# Patient Record
Sex: Female | Born: 1965 | Race: Black or African American | Hispanic: No | Smoking: Never smoker
Health system: Southern US, Community
[De-identification: ages and names within clinical notes are randomized; demographics above are authoritative.]

---

## 1998-05-17 ENCOUNTER — Other Ambulatory Visit: Admission: RE | Admit: 1998-05-17 | Discharge: 1998-05-17 | Payer: Self-pay | Admitting: Obstetrics & Gynecology

## 1998-10-04 ENCOUNTER — Inpatient Hospital Stay (HOSPITAL_COMMUNITY): Admission: AD | Admit: 1998-10-04 | Discharge: 1998-10-04 | Payer: Self-pay | Admitting: Obstetrics & Gynecology

## 1998-11-06 ENCOUNTER — Inpatient Hospital Stay (HOSPITAL_COMMUNITY): Admission: AD | Admit: 1998-11-06 | Discharge: 1998-11-09 | Payer: Self-pay | Admitting: Obstetrics and Gynecology

## 2000-04-05 ENCOUNTER — Emergency Department (HOSPITAL_COMMUNITY): Admission: EM | Admit: 2000-04-05 | Discharge: 2000-04-05 | Payer: Self-pay | Admitting: Emergency Medicine

## 2004-02-20 ENCOUNTER — Other Ambulatory Visit: Admission: RE | Admit: 2004-02-20 | Discharge: 2004-02-20 | Payer: Self-pay | Admitting: Family Medicine

## 2004-12-12 ENCOUNTER — Ambulatory Visit (HOSPITAL_COMMUNITY): Admission: RE | Admit: 2004-12-12 | Discharge: 2004-12-12 | Payer: Self-pay | Admitting: Family Medicine

## 2004-12-24 ENCOUNTER — Ambulatory Visit (HOSPITAL_COMMUNITY): Admission: RE | Admit: 2004-12-24 | Discharge: 2004-12-24 | Payer: Self-pay | Admitting: Family Medicine

## 2005-04-17 ENCOUNTER — Other Ambulatory Visit: Admission: RE | Admit: 2005-04-17 | Discharge: 2005-04-17 | Payer: Self-pay | Admitting: Family Medicine

## 2010-03-02 ENCOUNTER — Encounter: Payer: Self-pay | Admitting: Family Medicine

## 2011-05-21 ENCOUNTER — Ambulatory Visit (INDEPENDENT_AMBULATORY_CARE_PROVIDER_SITE_OTHER): Payer: 59 | Admitting: Physician Assistant

## 2011-05-21 VITALS — BP 112/71 | HR 81 | Temp 98.9°F | Resp 16 | Ht 61.0 in | Wt 122.0 lb

## 2011-05-21 DIAGNOSIS — H109 Unspecified conjunctivitis: Secondary | ICD-10-CM

## 2011-05-21 MED ORDER — POLYMYXIN B-TRIMETHOPRIM 10000-0.1 UNIT/ML-% OP SOLN: 1.0000 [drp] | OPHTHALMIC | Status: AC

## 2011-05-21 NOTE — Patient Instructions (Signed)
Warm compresses can help ease the discomfort, as can ibuprofen.  Wipe down ALL surfaces with a cleanser to disinfect. Wash your bed linens, too!

## 2011-05-21 NOTE — Progress Notes (Signed)
  Subjective:    Patient ID: Madeline Nguyen, female    DOB: 29-Mar-1965, 46 y.o.   MRN: [id number]  HPI  "I think I have conjunctivitis."  Works with children.  Left eye was irritated and red yesterday, awoke this am with eyes matted.  Today right eye beginning to feel irritated.No drainage.  Itchy, like sand in eyes. Child in her care evaluated today for possible pink eye. No history of FB or trauma.  Review of Systems No visual change.  No recent illness. No URI-type symptoms.    Objective:   Physical Exam  Vital signs noted. Well-developed, well nourished BF who is awake, alert and oriented, in NAD. HEENT: [place]/AT, PERRL, EOMI.  Sclera and conjunctiva are clear.  Funduscopic exam is normal bilaterally. Neck: supple, non-tender, no lymphadenopathey, thyromegaly. Skin: warm and dry with hyperpigmentation under the right eye consistent with rubbing.     Assessment & Plan:   1. Conjunctivitis  trimethoprim-polymyxin b (POLYTRIM) ophthalmic solution   While this may be allergic, she is at high risk for bacterial conjunctivitis given her work.

## 2011-09-23 ENCOUNTER — Other Ambulatory Visit (HOSPITAL_COMMUNITY)
Admission: RE | Admit: 2011-09-23 | Discharge: 2011-09-23 | Disposition: A | Discharge status: Home / Self Care | Payer: 59 | Source: Ambulatory Visit | Attending: Family Medicine | Admitting: Family Medicine

## 2011-09-23 DIAGNOSIS — Z124 Encounter for screening for malignant neoplasm of cervix: Secondary | ICD-10-CM | POA: Insufficient documentation

## 2013-08-19 ENCOUNTER — Encounter (HOSPITAL_COMMUNITY): Payer: Self-pay | Admitting: Emergency Medicine

## 2013-08-19 ENCOUNTER — Emergency Department (HOSPITAL_COMMUNITY)
Admission: EM | Admit: 2013-08-19 | Discharge: 2013-08-20 | Discharge status: Against Medical Advice | Payer: Managed Care, Other (non HMO) | Attending: Emergency Medicine | Admitting: Emergency Medicine

## 2013-08-19 DIAGNOSIS — T783XXA Angioneurotic edema, initial encounter: Secondary | ICD-10-CM | POA: Insufficient documentation

## 2013-08-19 DIAGNOSIS — T4995XA Adverse effect of unspecified topical agent, initial encounter: Secondary | ICD-10-CM | POA: Insufficient documentation

## 2013-08-19 NOTE — ED Notes (Signed)
Pt. presents with upper lip itching / swelling onset this evening after eating , airway intact / respirations unlabored.

## 2013-09-23 ENCOUNTER — Other Ambulatory Visit: Payer: Self-pay | Admitting: Family Medicine

## 2013-09-23 ENCOUNTER — Other Ambulatory Visit (HOSPITAL_COMMUNITY)
Admission: RE | Admit: 2013-09-23 | Discharge: 2013-09-23 | Disposition: A | Discharge status: Home / Self Care | Payer: Managed Care, Other (non HMO) | Source: Ambulatory Visit | Attending: Family Medicine | Admitting: Family Medicine

## 2013-09-23 DIAGNOSIS — Z124 Encounter for screening for malignant neoplasm of cervix: Secondary | ICD-10-CM | POA: Insufficient documentation

## 2013-09-23 DIAGNOSIS — Z1231 Encounter for screening mammogram for malignant neoplasm of breast: Secondary | ICD-10-CM

## 2013-09-28 LAB — CYTOLOGY - PAP

## 2013-11-07 ENCOUNTER — Ambulatory Visit: Payer: Managed Care, Other (non HMO)

## 2014-03-23 ENCOUNTER — Other Ambulatory Visit: Payer: Self-pay | Admitting: Family Medicine

## 2014-03-23 ENCOUNTER — Ambulatory Visit
Admission: RE | Admit: 2014-03-23 | Discharge: 2014-03-23 | Disposition: A | Discharge status: Home / Self Care | Payer: Managed Care, Other (non HMO) | Source: Ambulatory Visit | Attending: Family Medicine | Admitting: Family Medicine

## 2014-03-23 DIAGNOSIS — M549 Dorsalgia, unspecified: Secondary | ICD-10-CM

## 2016-09-01 ENCOUNTER — Encounter: Payer: Self-pay | Admitting: Podiatry

## 2016-09-01 ENCOUNTER — Ambulatory Visit (INDEPENDENT_AMBULATORY_CARE_PROVIDER_SITE_OTHER): Payer: 59 | Admitting: Podiatry

## 2016-09-01 ENCOUNTER — Ambulatory Visit (INDEPENDENT_AMBULATORY_CARE_PROVIDER_SITE_OTHER): Payer: 59

## 2016-09-01 DIAGNOSIS — M722 Plantar fascial fibromatosis: Secondary | ICD-10-CM | POA: Diagnosis not present

## 2016-09-01 NOTE — Patient Instructions (Signed)

## 2016-09-01 NOTE — Progress Notes (Signed)
   Subjective:    Patient ID: Madeline Nguyen, female    DOB: [birth year], 51 y.o.   MRN: [id number]  HPI 51 year old female presents the office if the concerns left heel pain which is been ongoing for about 3-4 months. She states that she gets pain when she first gets up and after being on her feet all day. She states that she can walk around her the day without any pain. She states that it came on suddenly but denies any recent injury or trauma. She states is gradually getting worse. She has tried icing as well as changing shoes without any significant improvement. The pain does not wake her up at night. No numbness or tingling. She has no other complaints today.   Review of Systems  Constitutional: Positive for fatigue.  Allergic/Immunologic: Positive for food allergies.       Objective:   Physical Exam General: AAO x3, NAD  Dermatological: Scar on the right foot from prior surgeries well-healed. Skin is warm, dry and supple bilateral. Nails x 10 are well manicured; remaining integument appears unremarkable at this time. There are no open sores, no preulcerative lesions, no rash or signs of infection present.  Vascular: Dorsalis Pedis artery and Posterior Tibial artery pedal pulses are 2/4 bilateral with immedate capillary fill time. Pedal hair growth present.  There is no pain with calf compression, swelling, warmth, erythema.   Neruologic: Grossly intact via light touch bilateral. Vibratory intact via tuning fork bilateral. Protective threshold with Semmes Wienstein monofilament intact to all pedal sites bilateral. Negative tinel sign.   Musculoskeletal: Tenderness to palpation along the plantar medial tubercle of the calcaneus at the insertion of plantar fascia on the left foot. There is no pain along the course of the plantar fascia within the arch of the foot. Plantar fascia appears to be intact. There is no pain with lateral compression of the calcaneus or pain with vibratory  sensation. There is no pain along the course or insertion of the achilles tendon. No other areas of tenderness to bilateral lower extremities. Muscular strength 5/5 in all groups tested bilateral.  Gait: Unassisted, Nonantalgic.     Assessment & Plan:  51 year old female with left heel pain likely plantar fasciitis with small inferior calcaneal spurring. -Treatment options discussed including all alternatives, risks, and complications -Etiology of symptoms were discussed -X-rays were obtained and reviewed with the patient. Small inferior calcaneal spurs present. No evidence of acute fracture identified. -Declined steroid injection today. -Prescribed mobic. Discussed side effects of the medication and directed to stop if any are to occur and call the office.  -Stretching, icing exercises daily -Plantar fascial brace dispensed -Shoe gear modifications and orthotics discussed. A coupon was given to her for [employer] to get inserts.  -RTC 4 weeks or sooner if needed.  Ovid CurdMatthew Joli Koob, DPM

## 2016-09-02 ENCOUNTER — Telehealth: Payer: Self-pay | Admitting: Podiatry

## 2016-09-02 MED ORDER — MELOXICAM 15 MG PO TABS: 15.0000 mg | ORAL_TABLET | Freq: Every day | ORAL | 0 refills | Status: AC

## 2016-09-02 NOTE — Telephone Encounter (Signed)
Left message informing pt the [facility]

## 2016-09-02 NOTE — Telephone Encounter (Signed)
I saw Dr. Ardelle AntonWagoner yesterday and he was supposed to call in an Rx for inflammation to my pharmacy but they have not received anything. Could you please get that called in.

## 2016-09-02 NOTE — Addendum Note (Signed)
Addended by: Alphia Kava'CONNELL, VALERY D on: 09/02/2016 04:31 PM   Modules accepted: Orders

## 2016-10-09 ENCOUNTER — Other Ambulatory Visit: Payer: Self-pay | Admitting: Family Medicine

## 2016-10-09 DIAGNOSIS — Z1231 Encounter for screening mammogram for malignant neoplasm of breast: Secondary | ICD-10-CM

## 2016-10-24 ENCOUNTER — Ambulatory Visit
Admission: RE | Admit: 2016-10-24 | Discharge: 2016-10-24 | Disposition: A | Discharge status: Home / Self Care | Payer: Managed Care, Other (non HMO) | Source: Ambulatory Visit | Attending: Family Medicine | Admitting: Family Medicine

## 2016-10-24 DIAGNOSIS — Z1231 Encounter for screening mammogram for malignant neoplasm of breast: Secondary | ICD-10-CM

## 2018-03-04 ENCOUNTER — Other Ambulatory Visit: Payer: Self-pay | Admitting: Family Medicine

## 2018-03-04 ENCOUNTER — Other Ambulatory Visit (HOSPITAL_COMMUNITY)
Admission: RE | Admit: 2018-03-04 | Discharge: 2018-03-04 | Disposition: A | Discharge status: Home / Self Care | Payer: 59 | Source: Ambulatory Visit | Attending: Family Medicine | Admitting: Family Medicine

## 2018-03-04 DIAGNOSIS — Z124 Encounter for screening for malignant neoplasm of cervix: Secondary | ICD-10-CM | POA: Insufficient documentation

## 2018-03-08 LAB — CYTOLOGY - PAP: Diagnosis: NEGATIVE

## 2018-09-24 ENCOUNTER — Other Ambulatory Visit: Payer: Self-pay

## 2018-09-24 DIAGNOSIS — Z20822 Contact with and (suspected) exposure to covid-19: Secondary | ICD-10-CM

## 2018-09-26 LAB — NOVEL CORONAVIRUS, NAA: SARS-CoV-2, NAA: NOT DETECTED

## 2019-10-25 ENCOUNTER — Other Ambulatory Visit: Payer: Self-pay

## 2019-10-25 ENCOUNTER — Ambulatory Visit (INDEPENDENT_AMBULATORY_CARE_PROVIDER_SITE_OTHER): Payer: 59 | Admitting: Podiatry

## 2019-10-25 ENCOUNTER — Ambulatory Visit (INDEPENDENT_AMBULATORY_CARE_PROVIDER_SITE_OTHER): Payer: 59

## 2019-10-25 DIAGNOSIS — M722 Plantar fascial fibromatosis: Secondary | ICD-10-CM | POA: Diagnosis not present

## 2019-10-25 MED ORDER — MELOXICAM 15 MG PO TABS: 15.0000 mg | ORAL_TABLET | Freq: Every day | ORAL | 1 refills | Status: AC

## 2019-10-25 NOTE — Patient Instructions (Signed)

## 2019-11-01 NOTE — Progress Notes (Signed)
Subjective:   Patient ID: Madeline Nguyen, female   DOB: 54 y.o.   MRN: [id number]   HPI 54 year old female presents the office today for concerns of recurrent left heel pain, plantar fasciitis.  She states meloxicam is helping.  She denies any recent injury or trauma she denies any swelling or any radiating pain.  She has pain in the bottom of her heel particularly after rest.  She has no other concerns today.   Review of Systems  All other systems reviewed and are negative.  No past medical history on file.  Past Surgical History:  Procedure Laterality Date  . CESAREAN SECTION       Current Outpatient Medications:  .  etodolac (LODINE) 400 MG tablet, Take 400 mg by mouth 2 (two) times daily as needed., Disp: , Rfl:  .  meloxicam (MOBIC) 15 MG tablet, Take 1 tablet (15 mg total) by mouth daily., Disp: 30 tablet, Rfl: 0 .  meloxicam (MOBIC) 15 MG tablet, Take 1 tablet (15 mg total) by mouth daily., Disp: 30 tablet, Rfl: 1  No Known Allergies        Objective:  Physical Exam  General: AAO x3, NAD  Dermatological: Skin is warm, dry and supple bilateral.There are no open sores, no preulcerative lesions, no rash or signs of infection present.  Vascular: Dorsalis Pedis artery and Posterior Tibial artery pedal pulses are 2/4 bilateral with immedate capillary fill time. There is no pain with calf compression, swelling, warmth, erythema.   Neruologic: Grossly intact via light touch bilateral.  Negative Tinel sign  Musculoskeletal: There is tenderness palpation on plantar medial tubercle of the calcaneus with insertion of plantar fascial.  The plantar pressure appears to be intact.  No pain with mild compression of calcaneus.  There is no pain in the Achilles tendon.  Flexor, extensor tendons appear intact.  Muscular strength 5/5 in all groups tested bilateral.  Gait: Unassisted, Nonantalgic.       Assessment:   54 year old female left foot plantar fasciitis    Plan:   -Treatment options discussed including all alternatives, risks, and complications -Etiology of symptoms were discussed -X-rays obtained reviewed.  No evidence of acute fracture or stress fracture. -Prescribed mobic. Discussed side effects of the medication and directed to stop if any are to occur and call the office.  -Would like to hold off on steroid injection -Discussed stretching, icing daily.  Return to bring the plantar surface peer discussed orthotics.  Vivi Barrack DPM

## 2019-12-26 ENCOUNTER — Ambulatory Visit: Payer: 59 | Admitting: Podiatry

## 2020-07-31 ENCOUNTER — Telehealth: Payer: 59 | Admitting: Podiatry

## 2020-07-31 ENCOUNTER — Other Ambulatory Visit: Payer: Self-pay | Admitting: Family Medicine

## 2020-07-31 DIAGNOSIS — Z1231 Encounter for screening mammogram for malignant neoplasm of breast: Secondary | ICD-10-CM

## 2020-07-31 NOTE — Telephone Encounter (Signed)
Tried to call patient and left a message stating to call me back at [phone]. Madeline Nguyen

## 2020-07-31 NOTE — Telephone Encounter (Signed)
Pt called and stated that she tried to get her Meloxicam refilled and the pharmacy told her to call the office

## 2020-08-01 NOTE — Telephone Encounter (Signed)
Called and spoke with the patient and patient stated that the left heel was tender and sore at times and my be on it too much and patient stated that she would call back to make an appointment. Misty Stanley

## 2020-09-21 ENCOUNTER — Other Ambulatory Visit: Payer: Self-pay

## 2020-09-21 ENCOUNTER — Ambulatory Visit
Admission: RE | Admit: 2020-09-21 | Discharge: 2020-09-21 | Disposition: A | Discharge status: Home / Self Care | Payer: 59 | Source: Ambulatory Visit | Attending: Family Medicine | Admitting: Family Medicine

## 2020-09-21 DIAGNOSIS — Z1231 Encounter for screening mammogram for malignant neoplasm of breast: Secondary | ICD-10-CM

## 2020-12-20 ENCOUNTER — Other Ambulatory Visit: Payer: Self-pay

## 2020-12-20 ENCOUNTER — Ambulatory Visit (INDEPENDENT_AMBULATORY_CARE_PROVIDER_SITE_OTHER): Payer: 59

## 2020-12-20 ENCOUNTER — Ambulatory Visit (INDEPENDENT_AMBULATORY_CARE_PROVIDER_SITE_OTHER): Payer: 59 | Admitting: Podiatry

## 2020-12-20 DIAGNOSIS — M7732 Calcaneal spur, left foot: Secondary | ICD-10-CM

## 2020-12-20 DIAGNOSIS — M722 Plantar fascial fibromatosis: Secondary | ICD-10-CM | POA: Diagnosis not present

## 2020-12-20 MED ORDER — MELOXICAM 15 MG PO TABS: 15.0000 mg | ORAL_TABLET | Freq: Every day | ORAL | 0 refills | Status: AC | PRN

## 2020-12-20 NOTE — Patient Instructions (Signed)

## 2020-12-23 NOTE — Progress Notes (Signed)
Subjective: 55 year old female presents the office today for concerns of reoccurrence of plantar fasciotomy left heel.  She said that when she takes the meloxicam it does help.  She has not been doing any other stretching, icing.  She can try to wear shoes and good arch support.  She states that her heel will not hurt for couple weeks and then will start to get tender again.  Wants to discuss other treatment options.  No recent injury.  Objective: AAO x3, NAD DP/PT pulses palpable bilaterally, CRT less than 3 seconds There is tenderness palpation along the plantar medial tubercle of the calcaneus at the insertion of plantar fascia on the left foot.  There is no pain with lateral compression of calcaneus.  No pain the arch of the foot.  No pain with Achilles tendon.  No edema, erythema.  MMT 5/5.  No pain with calf compression, swelling, warmth, erythema  Assessment: Left recurrence plantar fasciitis  Plan: -All treatment options discussed with the patient including all alternatives, risks, complications.  -Repeat x-rays obtained reviewed.  No evidence of fracture.  Inferior calcaneal spurring is present.  No significant changes. -At this point we discussed steroid injection but will hold off on that.  Refill meloxicam to use as needed.  Discussed stretching, icing daily.  She states that she is going to get better with that as well.  Continue shoes and good arch support.  Discussed other treatment options including physical therapy, EPAT as well.  -Patient encouraged to call the office with any questions, concerns, change in symptoms.   Vivi Barrack DPM

## 2021-02-19 ENCOUNTER — Ambulatory Visit: Payer: 59 | Admitting: Podiatry

## 2021-03-15 ENCOUNTER — Other Ambulatory Visit: Payer: Self-pay | Admitting: Family Medicine

## 2021-03-15 ENCOUNTER — Ambulatory Visit
Admission: RE | Admit: 2021-03-15 | Discharge: 2021-03-15 | Disposition: A | Discharge status: Home / Self Care | Payer: 59 | Source: Ambulatory Visit | Attending: Family Medicine | Admitting: Family Medicine

## 2021-03-15 DIAGNOSIS — R059 Cough, unspecified: Secondary | ICD-10-CM

## 2021-04-03 DIAGNOSIS — M545 Low back pain, unspecified: Secondary | ICD-10-CM | POA: Diagnosis not present

## 2021-04-08 DIAGNOSIS — M545 Low back pain, unspecified: Secondary | ICD-10-CM | POA: Diagnosis not present

## 2021-04-18 DIAGNOSIS — L439 Lichen planus, unspecified: Secondary | ICD-10-CM | POA: Diagnosis not present

## 2021-04-19 DIAGNOSIS — M545 Low back pain, unspecified: Secondary | ICD-10-CM | POA: Diagnosis not present

## 2021-07-15 DIAGNOSIS — R220 Localized swelling, mass and lump, head: Secondary | ICD-10-CM | POA: Diagnosis not present

## 2021-08-01 DIAGNOSIS — D649 Anemia, unspecified: Secondary | ICD-10-CM | POA: Diagnosis not present

## 2021-08-01 DIAGNOSIS — Z124 Encounter for screening for malignant neoplasm of cervix: Secondary | ICD-10-CM | POA: Diagnosis not present

## 2021-08-01 DIAGNOSIS — Z1211 Encounter for screening for malignant neoplasm of colon: Secondary | ICD-10-CM | POA: Diagnosis not present

## 2021-08-01 DIAGNOSIS — E7800 Pure hypercholesterolemia, unspecified: Secondary | ICD-10-CM | POA: Diagnosis not present

## 2021-08-01 DIAGNOSIS — M542 Cervicalgia: Secondary | ICD-10-CM | POA: Diagnosis not present

## 2021-08-01 DIAGNOSIS — Z0000 Encounter for general adult medical examination without abnormal findings: Secondary | ICD-10-CM | POA: Diagnosis not present

## 2021-08-01 DIAGNOSIS — L501 Idiopathic urticaria: Secondary | ICD-10-CM | POA: Diagnosis not present

## 2021-08-01 DIAGNOSIS — Z23 Encounter for immunization: Secondary | ICD-10-CM | POA: Diagnosis not present

## 2021-08-01 DIAGNOSIS — N951 Menopausal and female climacteric states: Secondary | ICD-10-CM | POA: Diagnosis not present

## 2021-08-05 DIAGNOSIS — Z1211 Encounter for screening for malignant neoplasm of colon: Secondary | ICD-10-CM | POA: Diagnosis not present

## 2021-10-03 DIAGNOSIS — Z23 Encounter for immunization: Secondary | ICD-10-CM | POA: Diagnosis not present

## 2021-10-07 ENCOUNTER — Other Ambulatory Visit: Payer: Self-pay | Admitting: Family Medicine

## 2021-10-07 DIAGNOSIS — Z1231 Encounter for screening mammogram for malignant neoplasm of breast: Secondary | ICD-10-CM

## 2021-11-08 ENCOUNTER — Ambulatory Visit
Admission: RE | Admit: 2021-11-08 | Discharge: 2021-11-08 | Disposition: A | Discharge status: Home / Self Care | Payer: 59 | Source: Ambulatory Visit | Attending: Family Medicine | Admitting: Family Medicine

## 2021-11-08 DIAGNOSIS — Z1231 Encounter for screening mammogram for malignant neoplasm of breast: Secondary | ICD-10-CM | POA: Diagnosis not present

## 2022-02-27 DIAGNOSIS — R059 Cough, unspecified: Secondary | ICD-10-CM | POA: Diagnosis not present

## 2022-02-27 DIAGNOSIS — J069 Acute upper respiratory infection, unspecified: Secondary | ICD-10-CM | POA: Diagnosis not present

## 2022-04-29 ENCOUNTER — Ambulatory Visit (INDEPENDENT_AMBULATORY_CARE_PROVIDER_SITE_OTHER): Payer: 59 | Admitting: Podiatry

## 2022-04-29 ENCOUNTER — Ambulatory Visit (INDEPENDENT_AMBULATORY_CARE_PROVIDER_SITE_OTHER): Payer: 59

## 2022-04-29 ENCOUNTER — Ambulatory Visit: Payer: 59

## 2022-04-29 DIAGNOSIS — M2141 Flat foot [pes planus] (acquired), right foot: Secondary | ICD-10-CM | POA: Diagnosis not present

## 2022-04-29 DIAGNOSIS — M79671 Pain in right foot: Secondary | ICD-10-CM

## 2022-04-29 DIAGNOSIS — M2142 Flat foot [pes planus] (acquired), left foot: Secondary | ICD-10-CM | POA: Diagnosis not present

## 2022-04-29 DIAGNOSIS — M7742 Metatarsalgia, left foot: Secondary | ICD-10-CM

## 2022-04-29 DIAGNOSIS — M722 Plantar fascial fibromatosis: Secondary | ICD-10-CM

## 2022-04-29 DIAGNOSIS — M7741 Metatarsalgia, right foot: Secondary | ICD-10-CM | POA: Diagnosis not present

## 2022-04-29 MED ORDER — MELOXICAM 15 MG PO TABS: 15.0000 mg | ORAL_TABLET | Freq: Every day | ORAL | 0 refills | Status: DC | PRN

## 2022-04-29 NOTE — Progress Notes (Signed)
  Subjective: Chief Complaint  Patient presents with   Foot Problem    Patient feels arch is dropping in bilateral feet, right over left, pain located in the arch , balls of feet, and heel    57 year old female presents the office for above concerns.  She feels that her arches are falling.  Getting pain under the toes, ball of her foot.  Pain of the heel has not been as bad but she recently was working so she is having some discomfort of the heels today.  No recent injury or changes otherwise.  Objective: AAO x3, NAD DP/PT pulses palpable bilaterally, CRT less than 3 seconds Decreased medial arch upon weightbearing.  There is mild tenderness palpation on the plantar aspect the calcaneus and the insertion of plantar fascia.  There is no pain with lateral compression of the calcaneus.  She has some discomfort submetatarsal area on the plantar aspect of the toes but no specific area of pinpoint tenderness.  No edema no erythema.  Mild bunion left foot no Tinel sign.  MMT 5/5. No pain with calf compression, swelling, warmth, erythema  Assessment: Plan fasciitis, metatarsalgia, flatfoot  Plan: -All treatment options discussed with the patient including all alternatives, risks, complications.  -X-rays were obtained reviewed.  3 views of the feet were obtained.  No evidence of acute fracture.  Previous bunionectomy noted on the right side.  Calcaneal spurring is present.  Decreased calcaneal inclination well. -Prescribed mobic. Discussed side effects of the medication and directed to stop if any are to occur and call the office.  -Dispensed power step inserts for her to help support the plantar fascia, arch of her foot. -Discussed stretching, icing. -Discussed shoe gear changes. -Patient encouraged to call the office with any questions, concerns, change in symptoms.   Trula Slade DPM

## 2022-04-29 NOTE — Patient Instructions (Signed)
For shoes I would recommend going to [facility]  Plantar Fasciitis (Heel Spur Syndrome) with Rehab The plantar fascia is a fibrous, ligament-like, soft-tissue structure that spans the bottom of the foot. Plantar fasciitis is a condition that causes pain in the foot due to inflammation of the tissue. SYMPTOMS  Pain and tenderness on the underneath side of the foot. Pain that worsens with standing or walking. CAUSES  Plantar fasciitis is caused by irritation and injury to the plantar fascia on the underneath side of the foot. Common mechanisms of injury include: Direct trauma to bottom of the foot. Damage to a small nerve that runs under the foot where the main fascia attaches to the heel bone. Stress placed on the plantar fascia due to bone spurs. RISK INCREASES WITH:  Activities that place stress on the plantar fascia (running, jumping, pivoting, or cutting). Poor strength and flexibility. Improperly fitted shoes. Tight calf muscles. Flat feet. Failure to warm-up properly before activity. Obesity. PREVENTION Warm up and stretch properly before activity. Allow for adequate recovery between workouts. Maintain physical fitness: Strength, flexibility, and endurance. Cardiovascular fitness. Maintain a health body weight. Avoid stress on the plantar fascia. Wear properly fitted shoes, including arch supports for individuals who have flat feet.  PROGNOSIS  If treated properly, then the symptoms of plantar fasciitis usually resolve without surgery. However, occasionally surgery is necessary.  RELATED COMPLICATIONS  Recurrent symptoms that may result in a chronic condition. Problems of the lower back that are caused by compensating for the injury, such as limping. Pain or weakness of the foot during push-off following surgery. Chronic inflammation, scarring, and partial or complete fascia tear, occurring more often from repeated injections.  TREATMENT  Treatment initially involves the  use of ice and medication to help reduce pain and inflammation. The use of strengthening and stretching exercises may help reduce pain with activity, especially stretches of the Achilles tendon. These exercises may be performed at home or with a therapist. Your caregiver may recommend that you use heel cups of arch supports to help reduce stress on the plantar fascia. Occasionally, corticosteroid injections are given to reduce inflammation. If symptoms persist for greater than 6 months despite non-surgical (conservative), then surgery may be recommended.   MEDICATION  If pain medication is necessary, then nonsteroidal anti-inflammatory medications, such as aspirin and ibuprofen, or other minor pain relievers, such as acetaminophen, are often recommended. Do not take pain medication within 7 days before surgery. Prescription pain relievers may be given if deemed necessary by your caregiver. Use only as directed and only as much as you need. Corticosteroid injections may be given by your caregiver. These injections should be reserved for the most serious cases, because they may only be given a certain number of times.  HEAT AND COLD Cold treatment (icing) relieves pain and reduces inflammation. Cold treatment should be applied for 10 to 15 minutes every 2 to 3 hours for inflammation and pain and immediately after any activity that aggravates your symptoms. Use ice packs or massage the area with a piece of ice (ice massage). Heat treatment may be used prior to performing the stretching and strengthening activities prescribed by your caregiver, physical therapist, or athletic trainer. Use a heat pack or soak the injury in warm water.  SEEK IMMEDIATE MEDICAL CARE IF: Treatment seems to offer no benefit, or the condition worsens. Any medications produce adverse side effects.  EXERCISES- RANGE OF MOTION (ROM) AND STRETCHING EXERCISES - Plantar Fasciitis (Heel Spur Syndrome) These exercises may  help you  when beginning to rehabilitate your injury. Your symptoms may resolve with or without further involvement from your physician, physical therapist or athletic trainer. While completing these exercises, remember:  Restoring tissue flexibility helps normal motion to return to the joints. This allows healthier, less painful movement and activity. An effective stretch should be held for at least 30 seconds. A stretch should never be painful. You should only feel a gentle lengthening or release in the stretched tissue.  RANGE OF MOTION - Toe Extension, Flexion Sit with your right / left leg crossed over your opposite knee. Grasp your toes and gently pull them back toward the top of your foot. You should feel a stretch on the bottom of your toes and/or foot. Hold this stretch for 10 seconds. Now, gently pull your toes toward the bottom of your foot. You should feel a stretch on the top of your toes and or foot. Hold this stretch for 10 seconds. Repeat  times. Complete this stretch 3 times per day.   RANGE OF MOTION - Ankle Dorsiflexion, Active Assisted Remove shoes and sit on a chair that is preferably not on a carpeted surface. Place right / left foot under knee. Extend your opposite leg for support. Keeping your heel down, slide your right / left foot back toward the chair until you feel a stretch at your ankle or calf. If you do not feel a stretch, slide your bottom forward to the edge of the chair, while still keeping your heel down. Hold this stretch for 10 seconds. Repeat 3 times. Complete this stretch 2 times per day.   STRETCH  Gastroc, Standing Place hands on wall. Extend right / left leg, keeping the front knee somewhat bent. Slightly point your toes inward on your back foot. Keeping your right / left heel on the floor and your knee straight, shift your weight toward the wall, not allowing your back to arch. You should feel a gentle stretch in the right / left calf. Hold this position for  10 seconds. Repeat 3 times. Complete this stretch 2 times per day.  STRETCH  Soleus, Standing Place hands on wall. Extend right / left leg, keeping the other knee somewhat bent. Slightly point your toes inward on your back foot. Keep your right / left heel on the floor, bend your back knee, and slightly shift your weight over the back leg so that you feel a gentle stretch deep in your back calf. Hold this position for 10 seconds. Repeat 3 times. Complete this stretch 2 times per day.  STRETCH  Gastrocsoleus, Standing  Note: This exercise can place a lot of stress on your foot and ankle. Please complete this exercise only if specifically instructed by your caregiver.  Place the ball of your right / left foot on a step, keeping your other foot firmly on the same step. Hold on to the wall or a rail for balance. Slowly lift your other foot, allowing your body weight to press your heel down over the edge of the step. You should feel a stretch in your right / left calf. Hold this position for 10 seconds. Repeat this exercise with a slight bend in your right / left knee. Repeat 3 times. Complete this stretch 2 times per day.   STRENGTHENING EXERCISES - Plantar Fasciitis (Heel Spur Syndrome)  These exercises may help you when beginning to rehabilitate your injury. They may resolve your symptoms with or without further involvement from your physician, physical therapist or  Product/process development scientist. While completing these exercises, remember:  Muscles can gain both the endurance and the strength needed for everyday activities through controlled exercises. Complete these exercises as instructed by your physician, physical therapist or athletic trainer. Progress the resistance and repetitions only as guided.  STRENGTH - Towel Curls Sit in a chair positioned on a non-carpeted surface. Place your foot on a towel, keeping your heel on the floor. Pull the towel toward your heel by only curling your toes. Keep  your heel on the floor. Repeat 3 times. Complete this exercise 2 times per day.  STRENGTH - Ankle Inversion Secure one end of a rubber exercise band/tubing to a fixed object (table, pole). Loop the other end around your foot just before your toes. Place your fists between your knees. This will focus your strengthening at your ankle. Slowly, pull your big toe up and in, making sure the band/tubing is positioned to resist the entire motion. Hold this position for 10 seconds. Have your muscles resist the band/tubing as it slowly pulls your foot back to the starting position. Repeat 3 times. Complete this exercises 2 times per day.  Document Released: 01/27/2005 Document Revised: 04/21/2011 Document Reviewed: 05/11/2008 [facility] Patient Information 2014 [place], [place].

## 2022-05-31 ENCOUNTER — Other Ambulatory Visit: Payer: Self-pay | Admitting: Podiatry

## 2022-07-01 ENCOUNTER — Ambulatory Visit: Payer: 59 | Admitting: Podiatry

## 2022-09-03 DIAGNOSIS — M79643 Pain in unspecified hand: Secondary | ICD-10-CM | POA: Diagnosis not present

## 2022-10-23 DIAGNOSIS — Z0000 Encounter for general adult medical examination without abnormal findings: Secondary | ICD-10-CM | POA: Diagnosis not present

## 2022-10-23 DIAGNOSIS — E7800 Pure hypercholesterolemia, unspecified: Secondary | ICD-10-CM | POA: Diagnosis not present

## 2022-10-23 DIAGNOSIS — L501 Idiopathic urticaria: Secondary | ICD-10-CM | POA: Diagnosis not present

## 2022-10-23 DIAGNOSIS — D179 Benign lipomatous neoplasm, unspecified: Secondary | ICD-10-CM | POA: Diagnosis not present

## 2022-10-23 DIAGNOSIS — L439 Lichen planus, unspecified: Secondary | ICD-10-CM | POA: Diagnosis not present

## 2022-10-23 DIAGNOSIS — Z1211 Encounter for screening for malignant neoplasm of colon: Secondary | ICD-10-CM | POA: Diagnosis not present

## 2022-10-23 DIAGNOSIS — M542 Cervicalgia: Secondary | ICD-10-CM | POA: Diagnosis not present

## 2022-10-23 DIAGNOSIS — N951 Menopausal and female climacteric states: Secondary | ICD-10-CM | POA: Diagnosis not present

## 2022-10-23 DIAGNOSIS — D649 Anemia, unspecified: Secondary | ICD-10-CM | POA: Diagnosis not present

## 2022-10-30 DIAGNOSIS — Z1211 Encounter for screening for malignant neoplasm of colon: Secondary | ICD-10-CM | POA: Diagnosis not present

## 2022-11-07 IMAGING — MG MM DIGITAL SCREENING BILAT W/ TOMO AND CAD
8 series · 8 of 24 positions shown · non-contrast
Comparison: Previous exam(s).

CLINICAL DATA: Screening.

EXAM:
DIGITAL SCREENING BILATERAL MAMMOGRAM WITH TOMOSYNTHESIS AND CAD
TECHNIQUE: Bilateral screening digital craniocaudal and mediolateral oblique
mammograms were obtained. Bilateral screening digital breast
tomosynthesis was performed. The images were evaluated with
computer-aided detection.

[L MLO synth-2D]
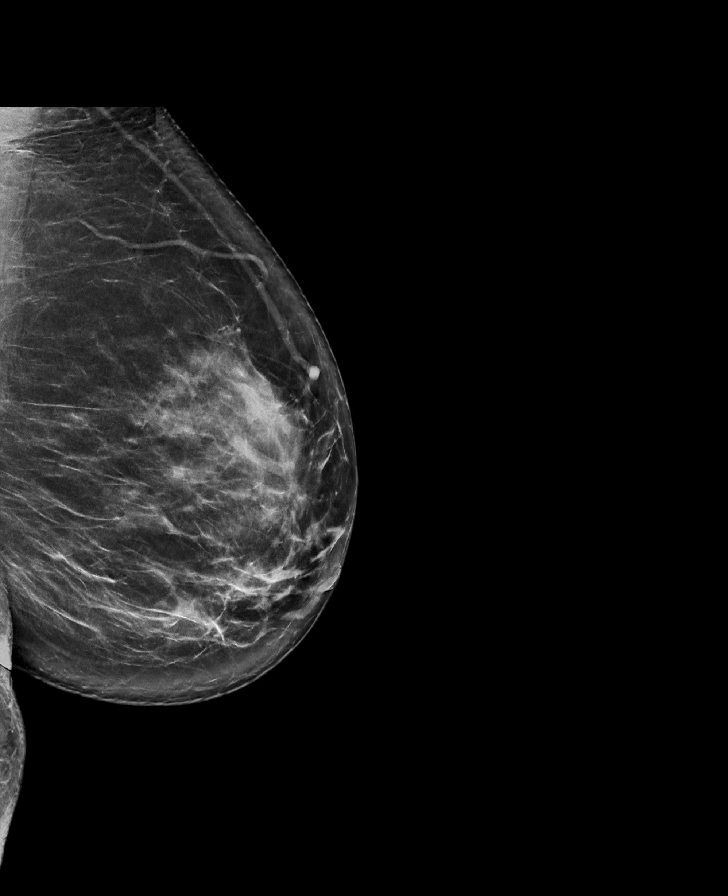

[R MLO synth-2D]
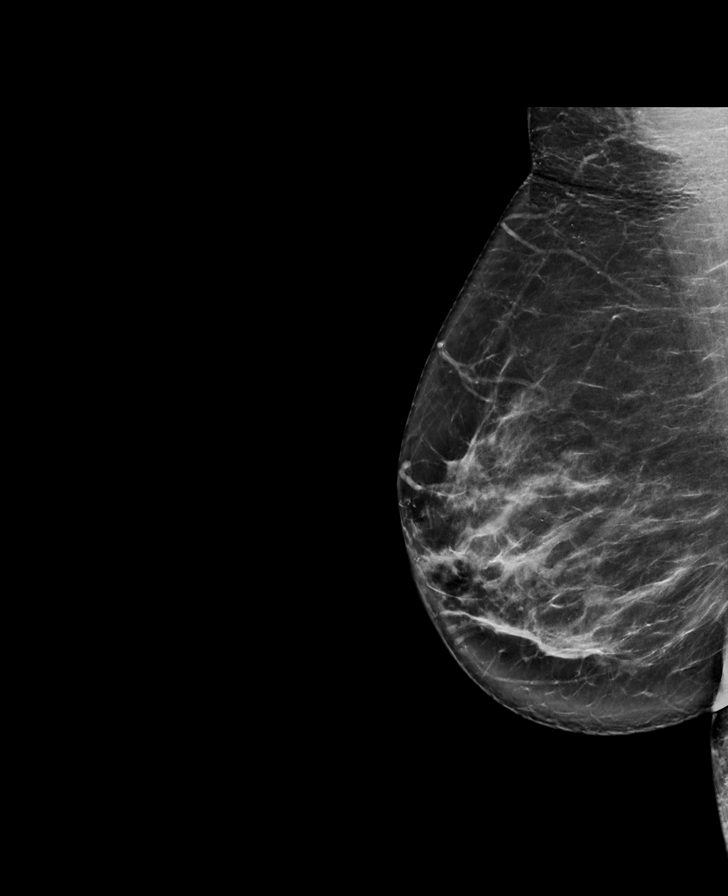

[L CC synth-2D]
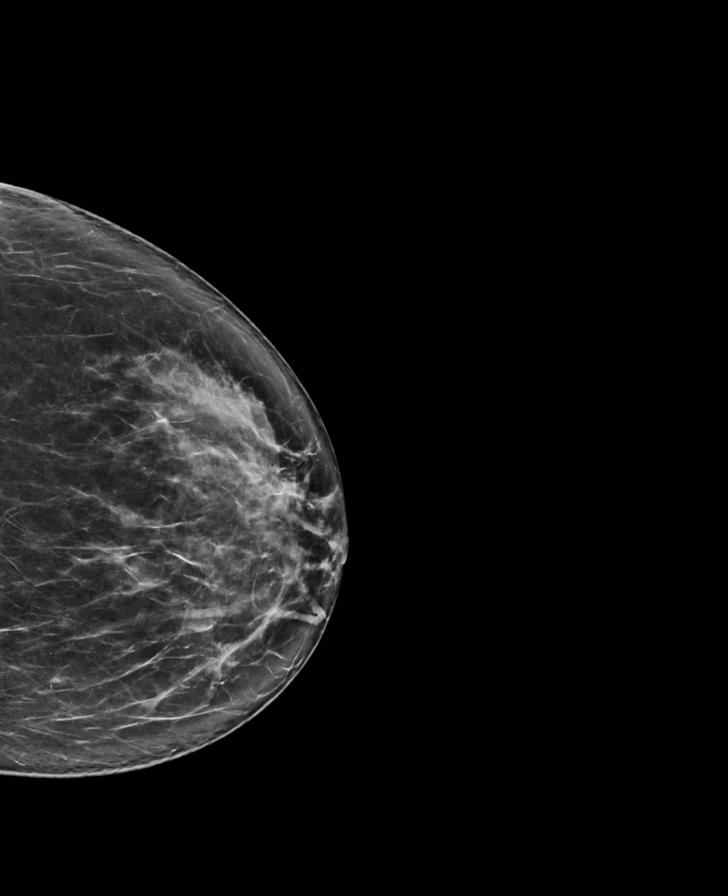

[R CC synth-2D]
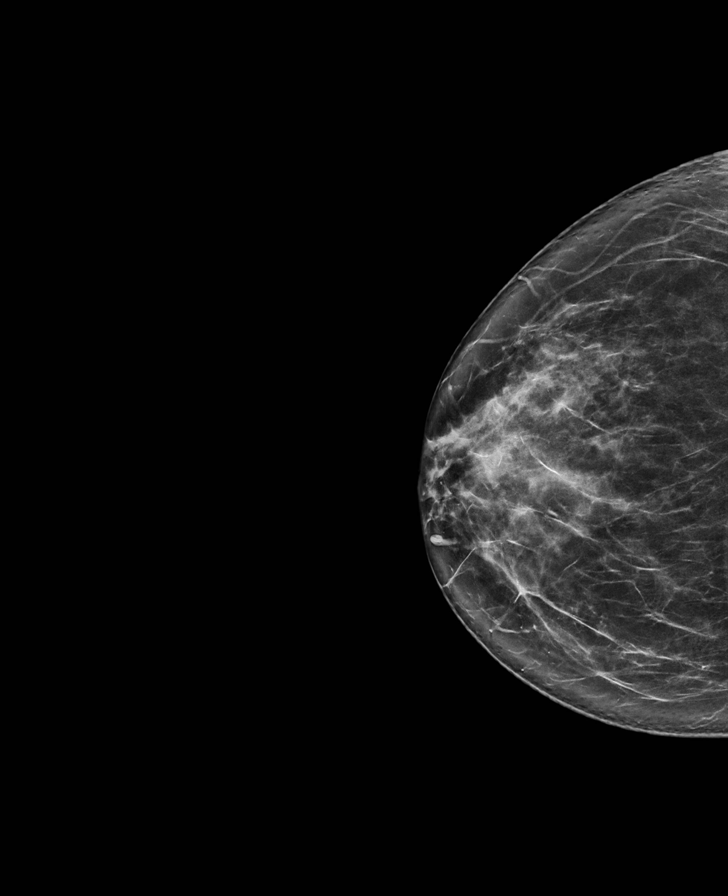

[R MLO tomo · tomo slice 43/86.0]
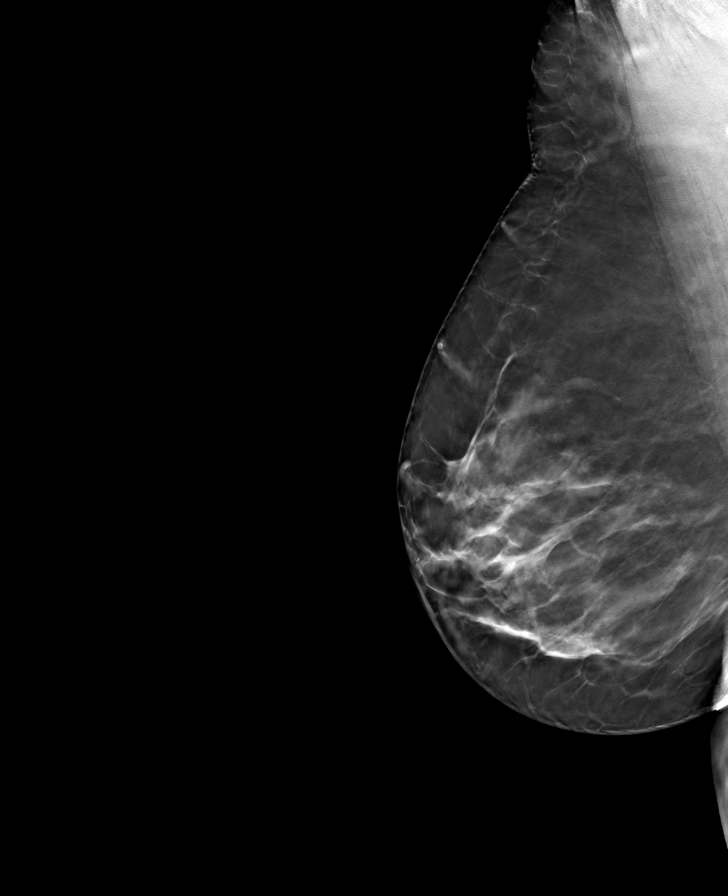

[L CC tomo · tomo slice 41/82.0]
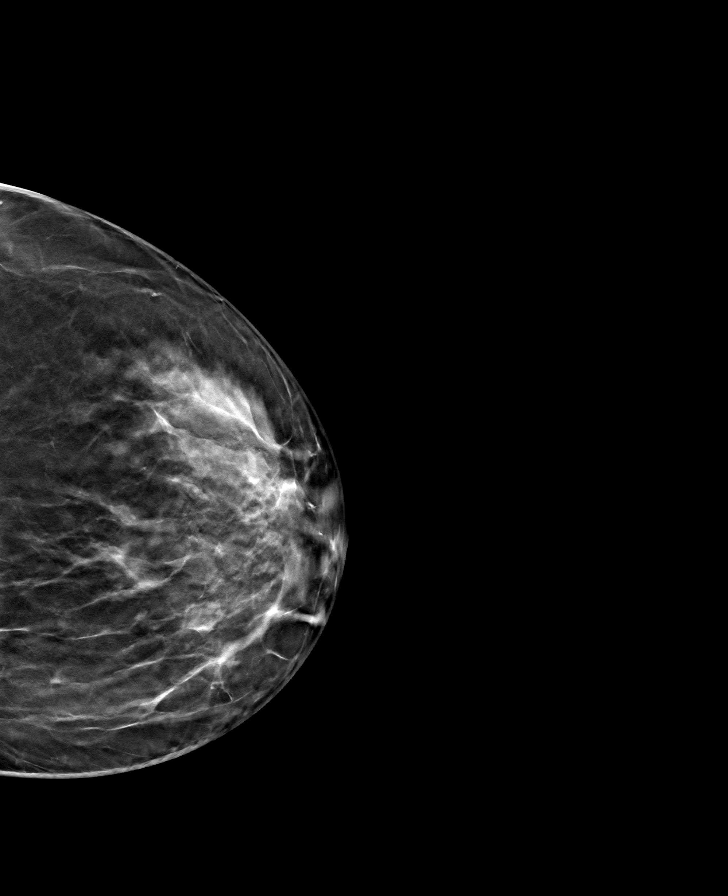

[R CC tomo · tomo slice 41/81.0]
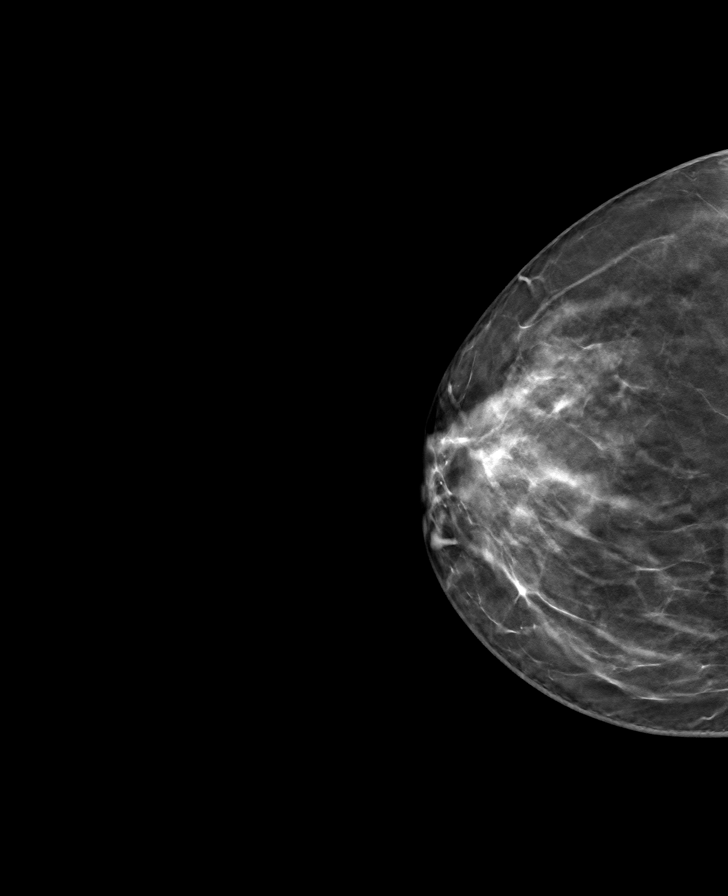

[L MLO tomo · tomo slice 43/84.0]
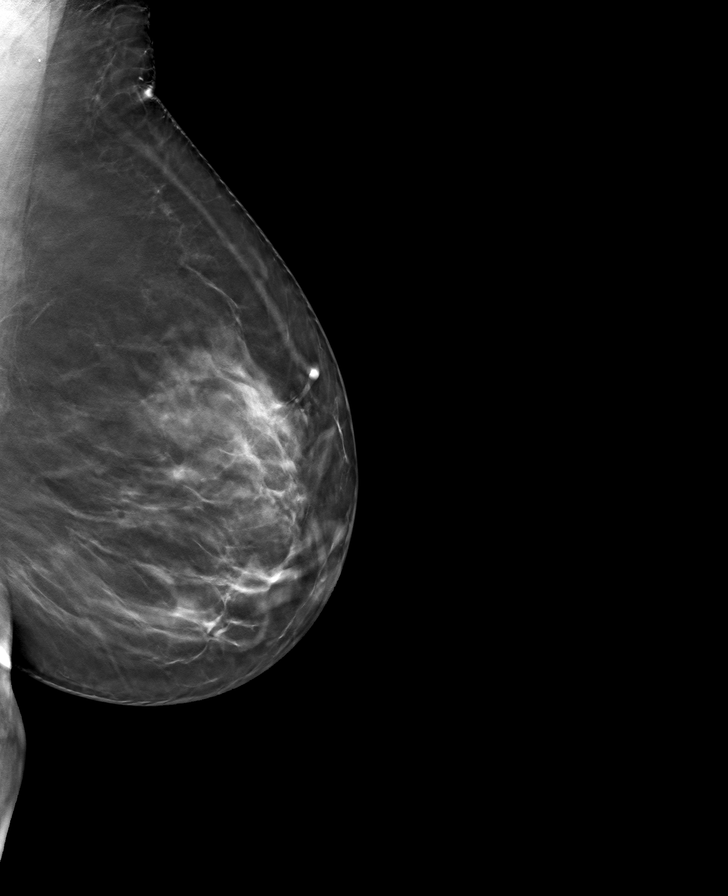

[8 of 24 positions shown; findings below may reference images not displayed]

ACR Breast Density Category c: The breast tissue is heterogeneously
dense, which may obscure small masses.
FINDINGS: There are no findings suspicious for malignancy.
IMPRESSION: No mammographic evidence of malignancy. A result letter of this
screening mammogram will be mailed directly to the patient.

RECOMMENDATION:
Screening mammogram in one year. (Code:Q3-W-BC3)

BI-RADS CATEGORY  1: Negative.

## 2022-12-29 ENCOUNTER — Other Ambulatory Visit: Payer: Self-pay | Admitting: Family Medicine

## 2022-12-29 DIAGNOSIS — Z1231 Encounter for screening mammogram for malignant neoplasm of breast: Secondary | ICD-10-CM

## 2023-01-23 ENCOUNTER — Encounter: Payer: Self-pay | Admitting: Radiology

## 2023-01-23 ENCOUNTER — Ambulatory Visit
Admission: RE | Admit: 2023-01-23 | Discharge: 2023-01-23 | Disposition: A | Discharge status: Home / Self Care | Payer: 59 | Source: Ambulatory Visit | Attending: Family Medicine | Admitting: Family Medicine

## 2023-01-23 DIAGNOSIS — Z1231 Encounter for screening mammogram for malignant neoplasm of breast: Secondary | ICD-10-CM | POA: Diagnosis not present

## 2023-02-05 DIAGNOSIS — M7062 Trochanteric bursitis, left hip: Secondary | ICD-10-CM | POA: Diagnosis not present

## 2023-04-16 DIAGNOSIS — M6530 Trigger finger, unspecified finger: Secondary | ICD-10-CM | POA: Diagnosis not present

## 2023-04-16 DIAGNOSIS — L209 Atopic dermatitis, unspecified: Secondary | ICD-10-CM | POA: Diagnosis not present

## 2023-04-16 DIAGNOSIS — R059 Cough, unspecified: Secondary | ICD-10-CM | POA: Diagnosis not present

## 2023-04-16 DIAGNOSIS — Z780 Asymptomatic menopausal state: Secondary | ICD-10-CM | POA: Diagnosis not present

## 2023-06-05 DIAGNOSIS — L209 Atopic dermatitis, unspecified: Secondary | ICD-10-CM | POA: Diagnosis not present

## 2023-06-28 ENCOUNTER — Other Ambulatory Visit: Payer: Self-pay | Admitting: Podiatry

## 2023-07-31 DIAGNOSIS — H5203 Hypermetropia, bilateral: Secondary | ICD-10-CM | POA: Diagnosis not present

## 2023-12-30 DIAGNOSIS — D649 Anemia, unspecified: Secondary | ICD-10-CM | POA: Diagnosis not present

## 2023-12-30 DIAGNOSIS — L209 Atopic dermatitis, unspecified: Secondary | ICD-10-CM | POA: Diagnosis not present

## 2023-12-30 DIAGNOSIS — L439 Lichen planus, unspecified: Secondary | ICD-10-CM | POA: Diagnosis not present

## 2023-12-30 DIAGNOSIS — N950 Postmenopausal bleeding: Secondary | ICD-10-CM | POA: Diagnosis not present

## 2023-12-30 DIAGNOSIS — Z23 Encounter for immunization: Secondary | ICD-10-CM | POA: Diagnosis not present

## 2023-12-30 DIAGNOSIS — Z1211 Encounter for screening for malignant neoplasm of colon: Secondary | ICD-10-CM | POA: Diagnosis not present

## 2023-12-30 DIAGNOSIS — Z131 Encounter for screening for diabetes mellitus: Secondary | ICD-10-CM | POA: Diagnosis not present

## 2023-12-30 DIAGNOSIS — D179 Benign lipomatous neoplasm, unspecified: Secondary | ICD-10-CM | POA: Diagnosis not present

## 2023-12-30 DIAGNOSIS — L501 Idiopathic urticaria: Secondary | ICD-10-CM | POA: Diagnosis not present

## 2023-12-30 DIAGNOSIS — Z1159 Encounter for screening for other viral diseases: Secondary | ICD-10-CM | POA: Diagnosis not present

## 2023-12-30 DIAGNOSIS — E7800 Pure hypercholesterolemia, unspecified: Secondary | ICD-10-CM | POA: Diagnosis not present

## 2023-12-30 DIAGNOSIS — D509 Iron deficiency anemia, unspecified: Secondary | ICD-10-CM | POA: Diagnosis not present

## 2023-12-30 DIAGNOSIS — N951 Menopausal and female climacteric states: Secondary | ICD-10-CM | POA: Diagnosis not present

## 2024-01-06 DIAGNOSIS — Z1211 Encounter for screening for malignant neoplasm of colon: Secondary | ICD-10-CM | POA: Diagnosis not present

## 2024-01-13 ENCOUNTER — Other Ambulatory Visit: Payer: Self-pay | Admitting: Family Medicine

## 2024-01-13 DIAGNOSIS — Z1231 Encounter for screening mammogram for malignant neoplasm of breast: Secondary | ICD-10-CM

## 2024-02-05 ENCOUNTER — Ambulatory Visit

## 2024-03-02 ENCOUNTER — Other Ambulatory Visit: Payer: Self-pay | Admitting: Nurse Practitioner

## 2024-03-02 DIAGNOSIS — N852 Hypertrophy of uterus: Secondary | ICD-10-CM

## 2024-03-02 DIAGNOSIS — N950 Postmenopausal bleeding: Secondary | ICD-10-CM

## 2024-03-04 ENCOUNTER — Ambulatory Visit
Admission: RE | Admit: 2024-03-04 | Discharge: 2024-03-04 | Disposition: A | Source: Ambulatory Visit | Attending: Family Medicine | Admitting: Family Medicine

## 2024-03-04 DIAGNOSIS — Z1231 Encounter for screening mammogram for malignant neoplasm of breast: Secondary | ICD-10-CM

## 2024-03-11 ENCOUNTER — Ambulatory Visit
Admission: RE | Admit: 2024-03-11 | Discharge: 2024-03-11 | Disposition: A | Source: Ambulatory Visit | Attending: Nurse Practitioner | Admitting: Nurse Practitioner

## 2024-03-11 DIAGNOSIS — N852 Hypertrophy of uterus: Secondary | ICD-10-CM

## 2024-03-11 DIAGNOSIS — N950 Postmenopausal bleeding: Secondary | ICD-10-CM
